# Patient Record
Sex: Female | Born: 1970 | Race: White | Hispanic: No | Marital: Married
Health system: Northeastern US, Academic
[De-identification: ages and names within clinical notes are randomized; demographics above are authoritative.]

---

## 2022-10-12 ENCOUNTER — Ambulatory Visit: Admit: 2022-10-12 | Discharge: 2022-10-12 | Payer: BLUE CROSS/BLUE SHIELD

## 2022-10-12 NOTE — Procedures (Signed)
Medication List          Accurate as of Oct 12, 2022  9:11 AM. Always use your most recent med list.            calcium polycarbophiL 625 mg tablet  Commonly known as: Fibercon     meloxicam 15 mg tablet  Commonly known as: Mobic     multivitamin with folic acid 400 mcg tablet tablet                 Additional Instructions:   Medication Instructions  1. You may have to stop taking any of the following pain or anti-inflammatory drugs one week prior to your surgery:  aspirin, ibuprofen (Advil, Motrin), and naproxen (Naprosyn).  Please ask your surgeon about these medications if haven't already received directions.  2. You may take acetaminophen (Tylenol) for pain/discomfort.  3. Only take the following medications in the morning on the day of your surgery: No medication  4. 10/19/22: Stop Meloxicam      Do's  1. DO bathe or shower the night before or the morning of surgery.    2. DO wear loose, comfortable clothing to the hospital on the day of surgery.  3. DO bring a case for your eye glasses, contact lenses or hearing aids.    Do Not's  1. DO NOT eat any solid foods after midnight the night before surgery.  You may drink CLEAR liquids (water, Gatorade, apple juice) up to 2 hours before your scheduled surgery time (NO milk, orange juice, smoothies, or anything cloudy).    2. DO NOT use breath mints or chew gum.   3. DO NOT use any deodorants, creams, colognes, powders, lotions, make-up, moisturizers, hair clips, barrettes or hair products (hair gel, hair spray or mousse).    4. DO NOT apply nail polish.  Please remove all existing nail polish.  It can affect the anesthesia monitoring equipment.   Please remove any/all piercings.   5. DO NOT shave near where you will have surgery for up to 2 days before the day of surgery.  Shaving can irritate the skin and can cause infection.  6. DO NOT bring valuables.  Leave all jewelry at home.  7. DO NOT plan to drive yourself home from the hospital.  You must arrange for  transportation home by a responsible adult.    Other Instructions  1. Please check in at the Surgical Admission Check in - [facility], 5th floor.  2. Please call [phone]) to confirm your arrival time after 2pm, the day before your surgery.

## 2022-10-12 NOTE — Anesthesia Pre-Procedure Evaluation (Signed)
Patient: Lydia Miles    Procedure Information       Date/Time: 10/26/22 0740    Procedure: Arthroplasty, Knee, Total (Left: Knee)    Location: [facility] / [facility] Operating [facility]    Surgeons: Darryl Lent, MD          Lydia Miles 52 y.o. obese (BMI 75) female with OSA using CPAP, inactive hypothyroidism (nl TSH now off replacement), and OA bilateral knees. Patient presenting for Left Total Knee Arthroplasty.      MEDICATIONS  Allergies   Allergen Reactions    Penicillins Anaphylaxis    Sulfa (Sulfonamide Antibiotics) Anaphylaxis    Levothyroxine        Current Medications:     Current Outpatient Medications:     calcium polycarbophiL (Fibercon) 625 mg tablet, Take 1,250 mg by mouth once daily., Disp: , Rfl:     meloxicam (Mobic) 15 mg tablet, Take 15 mg by mouth once daily., Disp: , Rfl:     multivitamin with folic acid 400 mcg tablet tablet, Take 1 tablet by mouth once daily., Disp: , Rfl:     SURGICAL HISTORY  Past Surgical History:   Procedure Laterality Date    APPENDECTOMY      CESAREAN SECTION, LOW TRANSVERSE      DILATION AND CURETTAGE OF UTERUS          MEDICAL HISTORY AND REVIEW OF SYSTEMS  Relevant Problems   Anesthesia (within normal limits)   (-) History of anesthesia complications      Cardio (within normal limits)      Pulmonary   (+) OSA on CPAP      GI (within normal limits)      GU/Renal (within normal limits)      Endo   (+) Obesity (BMI 38.25)   (+) Subclinical hypothyroidism (euthyroid off medication)      Hematology (within normal limits)      Neuro/Psych (within normal limits)      Musculoskeletal   (+) Osteoarthritis of both knees       Functional Status  > 4 mets    COVID Status  Partially Vaccinated    Social History  Social History     Tobacco Use    Smoking status: Former     Types: Cigarettes     Quit date: 2000     Years since quitting: 24.3    Smokeless tobacco: Never   Substance Use Topics    Alcohol use: Yes     Comment: 1-2 drinks/months    Drug use: Never        PHYSICAL EXAMINATION  Ht 1.676 m   Wt 107.5 kg   BMI 38.25 kg/m   Physical Exam    Airway  Neck ROM: full     Cardiovascular   Functional capacity: greater than or equal to 4 METS without symptoms   Dental - normal exam     Pulmonary    Abdominal    General            LABS  OSH 10/04/22: WBC 7.5, RBC 4.64, Hgb 13.0, Hct 38.9, Plt 361, Na 138, K 4.4, Cl 102, CO2 28, Gluc 80, BUN 19, Cr 0.54, GFR 112, TSH 4.0      TESTS AND IMAGING  OSH ECG 10/04/22: SR 69bpm      ASSESSMENT AND PLAN  Lydia Miles 52 y.o. female with the above listed PMH presenting for Arthroplasty, Knee, Total. The listed Medical history, Medications, Lab  Values, and Testing have been reviewed and addressed and we will proceed with the following plan:     Anesthesia Plan    ASA 3     spinal         Preprocedure Evaluation: No items outstanding.    Complex patient: No      Based on available information the patient has been optimized for surgery and does not require further testing    Risk Assessment Scores  Sleep Apnea Risk (MD Calc)  5 (High Risk of OSA)    30 day perioperative cardiac risk   cardiac event risk by 2011 Gupta (MD Calc)   cardiac event risk by 2019 RCRI (MD Calc)

## 2022-10-26 ENCOUNTER — Non-Acute Institutional Stay: Admit: 2022-10-26 | Payer: BLUE CROSS/BLUE SHIELD

## 2022-10-26 MED ORDER — cetirizine (ZyrTEC) tablet 10 mg
10 | Freq: Every day | ORAL | Status: DC
Start: 2022-10-26 — End: 2022-10-27

## 2022-10-26 MED ORDER — ceFAZolin (Ancef) injection
1 | INTRAMUSCULAR | Status: DC | PRN
Start: 2022-10-26 — End: 2022-10-26
  Administered 2022-10-26: 12:00:00 2 via INTRAVENOUS

## 2022-10-26 MED ORDER — ondansetron ODT (Zofran-ODT) disintegrating tablet 4 mg
4 | Freq: Three times a day (TID) | ORAL | Status: DC | PRN
Start: 2022-10-26 — End: 2022-10-27

## 2022-10-26 MED ORDER — ondansetron (Zofran) injection 4 mg
4 | Freq: Three times a day (TID) | INTRAMUSCULAR | Status: DC | PRN
Start: 2022-10-26 — End: 2022-10-27

## 2022-10-26 MED ORDER — acetaminophen (Tylenol) suppository 650 mg
650 | Freq: Four times a day (QID) | RECTAL | Status: DC
Start: 2022-10-26 — End: 2022-10-27

## 2022-10-26 MED ORDER — acetaminophen (Tylenol) tablet 975 mg
325 | Freq: Once | ORAL | Status: AC
Start: 2022-10-26 — End: 2022-10-26
  Administered 2022-10-26: 11:00:00 975 mg via ORAL

## 2022-10-26 MED ORDER — ropivacaine (Naropin) 0.2 % infusion
0.2 | INTRAMUSCULAR | Status: DC | PRN
Start: 2022-10-26 — End: 2022-10-26
  Administered 2022-10-26: 14:00:00 100

## 2022-10-26 MED ORDER — vancomycin in 0.9 % sodium chl 1.5 gram/300 mL solution 1,500 mg
1.5 | Freq: Once | INTRAVENOUS | Status: DC
Start: 2022-10-26 — End: 2022-10-26

## 2022-10-26 MED ORDER — docusate sodium (Colace) capsule 100 mg
100 | Freq: Two times a day (BID) | ORAL | Status: DC
Start: 2022-10-26 — End: 2022-10-27
  Administered 2022-10-27 (×2): 100 mg via ORAL

## 2022-10-26 MED ORDER — lactated Ringer's infusion
INTRAVENOUS | Status: DC | PRN
Start: 2022-10-26 — End: 2022-10-26
  Administered 2022-10-26: 12:00:00 via INTRAVENOUS

## 2022-10-26 MED ORDER — diphenhydrAMINE (BENADryl) injection 25 mg
50 | Freq: Four times a day (QID) | INTRAMUSCULAR | Status: DC | PRN
Start: 2022-10-26 — End: 2022-10-27

## 2022-10-26 MED ORDER — aspirin EC tablet 81 mg
81 | Freq: Two times a day (BID) | ORAL | Status: DC
Start: 2022-10-26 — End: 2022-10-27
  Administered 2022-10-27: 14:00:00 81 mg via ORAL

## 2022-10-26 MED ORDER — HYDROmorphone (Dilaudid) injection 0.2 mg
2 | INTRAMUSCULAR | Status: DC | PRN
Start: 2022-10-26 — End: 2022-10-26
  Administered 2022-10-26: 16:00:00 0.2 mg via INTRAVENOUS

## 2022-10-26 MED ORDER — tranexamic acid (Cyklokapron) injection
1000 | INTRAVENOUS | Status: DC | PRN
Start: 2022-10-26 — End: 2022-10-26
  Administered 2022-10-26 (×2): 1000 via INTRAVENOUS

## 2022-10-26 MED ORDER — ketorolac (Toradol) injection 30 mg
30 | Freq: Four times a day (QID) | INTRAMUSCULAR | Status: DC
Start: 2022-10-26 — End: 2022-10-27
  Administered 2022-10-26 – 2022-10-27 (×4): 30 mg via INTRAVENOUS

## 2022-10-26 MED ORDER — propofol (Diprivan) injection
10 | INTRAVENOUS | Status: DC | PRN
Start: 2022-10-26 — End: 2022-10-26
  Administered 2022-10-26: 12:00:00 125 via INTRAVENOUS

## 2022-10-26 MED ORDER — acetaminophen (Tylenol) tablet 650 mg
325 | Freq: Four times a day (QID) | ORAL | Status: DC
Start: 2022-10-26 — End: 2022-10-27
  Administered 2022-10-26 – 2022-10-27 (×5): 650 mg via ORAL

## 2022-10-26 MED ORDER — oxyCODONE ER (OxyCONTIN) 12 hr tablet 10 mg
10 | Freq: Once | ORAL | Status: AC
Start: 2022-10-26 — End: 2022-10-26
  Administered 2022-10-26: 11:00:00 10 mg via ORAL

## 2022-10-26 MED ORDER — lactated Ringer's infusion
INTRAVENOUS | Status: DC
Start: 2022-10-26 — End: 2022-10-26
  Administered 2022-10-26: 16:00:00 100 mL/h via INTRAVENOUS

## 2022-10-26 MED ORDER — oxyCODONE (Roxicodone) immediate release tablet 5 mg
5 | ORAL | Status: DC | PRN
Start: 2022-10-26 — End: 2022-10-27
  Administered 2022-10-26 – 2022-10-27 (×3): 5 mg via ORAL

## 2022-10-26 MED ORDER — oxyCODONE (Roxicodone) immediate release tablet 10 mg
5 | ORAL | Status: DC | PRN
Start: 2022-10-26 — End: 2022-10-27

## 2022-10-26 MED ORDER — oxyCODONE (Roxicodone) solution 5 mg
5 | ORAL | Status: DC | PRN
Start: 2022-10-26 — End: 2022-10-27

## 2022-10-26 MED ORDER — fentaNYL (Sublimaze) injection 25 mcg
50 | INTRAMUSCULAR | Status: DC | PRN
Start: 2022-10-26 — End: 2022-10-26
  Administered 2022-10-26 (×2): 25 ug via INTRAVENOUS

## 2022-10-26 MED ORDER — docusate sodium (Colace) oral liquid 100 mg
50 | Freq: Two times a day (BID) | ORAL | Status: DC
Start: 2022-10-26 — End: 2022-10-27

## 2022-10-26 MED ORDER — HYDROmorphone (Dilaudid) injection 0.4 mg
2 | INTRAMUSCULAR | Status: DC | PRN
Start: 2022-10-26 — End: 2022-10-27

## 2022-10-26 MED ORDER — midazolam (Versed) injection
1 | INTRAMUSCULAR | Status: DC | PRN
Start: 2022-10-26 — End: 2022-10-26
  Administered 2022-10-26: 12:00:00 2 via INTRAVENOUS

## 2022-10-26 MED ORDER — multivitamin with folic acid 1 tablet
400 | Freq: Every day | ORAL | Status: DC
Start: 2022-10-26 — End: 2022-10-27
  Administered 2022-10-27: 14:00:00 1 via ORAL

## 2022-10-26 MED ORDER — psyllium (Metamucil) 3.4 gram packet 1 packet
3.4 | Freq: Every day | ORAL | Status: DC
Start: 2022-10-26 — End: 2022-10-27
  Administered 2022-10-27: 14:00:00 1 via ORAL

## 2022-10-26 MED ORDER — ondansetron (Zofran) injection
4 | INTRAMUSCULAR | Status: DC | PRN
Start: 2022-10-26 — End: 2022-10-26
  Administered 2022-10-26: 13:00:00 4 via INTRAVENOUS

## 2022-10-26 MED ORDER — acetaminophen (Tylenol) solution 650 mg
325 | Freq: Four times a day (QID) | ORAL | Status: DC
Start: 2022-10-26 — End: 2022-10-27

## 2022-10-26 MED ORDER — ketorolac (Toradol) injection
30 | INTRAMUSCULAR | Status: DC | PRN
Start: 2022-10-26 — End: 2022-10-26
  Administered 2022-10-26: 14:00:00 15 via INTRA_ARTICULAR

## 2022-10-26 MED ORDER — dexAMETHasone (Decadron) injection
4 | INTRAMUSCULAR | Status: DC | PRN
Start: 2022-10-26 — End: 2022-10-26
  Administered 2022-10-26: 14:00:00 1 via INTRAMUSCULAR

## 2022-10-26 MED ORDER — celecoxib (CeleBREX) capsule 200 mg
200 | Freq: Once | ORAL | Status: AC
Start: 2022-10-26 — End: 2022-10-26
  Administered 2022-10-26: 11:00:00 200 mg via ORAL

## 2022-10-26 MED ORDER — oxyCODONE (Roxicodone) solution 10 mg
5 | ORAL | Status: DC | PRN
Start: 2022-10-26 — End: 2022-10-27

## 2022-10-26 MED ORDER — ceFAZolin (Ancef) 2 g in sodium chloride 0.9 % 100 mL IVPB-MBP
2 | Freq: Three times a day (TID) | INTRAMUSCULAR | Status: AC
Start: 2022-10-26 — End: 2022-10-27
  Administered 2022-10-26 – 2022-10-27 (×2): 2 g via INTRAVENOUS

## 2022-10-26 MED ORDER — naloxone (Narcan) injection 0.1 mg
0.4 | INTRAMUSCULAR | Status: DC | PRN
Start: 2022-10-26 — End: 2022-10-27

## 2022-10-26 MED ORDER — BUPivacaine-EPINEPHrine (PF) (Marcaine w/EPI) 0.25 %-1:200,000 injection
0.25 | INTRAMUSCULAR | Status: DC | PRN
Start: 2022-10-26 — End: 2022-10-26
  Administered 2022-10-26: 14:00:00 30

## 2022-10-26 MED ORDER — ondansetron (Zofran) injection 4 mg
4 | INTRAMUSCULAR | Status: DC | PRN
Start: 2022-10-26 — End: 2022-10-26

## 2022-10-26 MED ORDER — fentaNYL (Sublimaze) injection
50 | INTRAMUSCULAR | Status: DC | PRN
Start: 2022-10-26 — End: 2022-10-26
  Administered 2022-10-26 (×2): 50 via INTRAVENOUS

## 2022-10-26 MED ORDER — phenylephrine 20 mg/250 mL (80 mcg/mL) in NS infusion
80 | INTRAVENOUS | Status: DC | PRN
Start: 2022-10-26 — End: 2022-10-26
  Administered 2022-10-26: 12:00:00 .5 via INTRAVENOUS

## 2022-10-26 MED FILL — CETIRIZINE 10 MG TABLET: 10 10 mg | ORAL | Qty: 1

## 2022-10-26 MED FILL — ACETAMINOPHEN 325 MG TABLET: 325 325 mg | ORAL | Qty: 2

## 2022-10-26 MED FILL — CEFAZOLIN IVPB 2 G IN 100 ML NS (MINI-BAG PLUS): 2.0000 2.0000 g | Qty: 2000

## 2022-10-26 MED FILL — FENTANYL (PF) 50 MCG/ML INJECTION SOLUTION: 50 50 mcg/mL | INTRAMUSCULAR | Qty: 2

## 2022-10-26 MED FILL — HYDROMORPHONE 2 MG/ML INJECTION WRAPPER: 2 2 mg/mL | INTRAMUSCULAR | Qty: 1

## 2022-10-26 MED FILL — OXYCODONE 5 MG TABLET: 5 5 mg | ORAL | Qty: 1

## 2022-10-26 MED FILL — KETOROLAC 30 MG/ML (1 ML) INJECTION SOLUTION: 30 30 mg/mL (1 mL) | INTRAMUSCULAR | Qty: 1

## 2022-10-26 MED FILL — OXYCODONE ER 10 MG TABLET,CRUSH RESISTANT,EXTENDED RELEASE 12 HR: 10 10 mg | ORAL | Qty: 1

## 2022-10-26 NOTE — Interval H&P Note (Deleted)
H&P reviewed. The patient was examined and there are no changes to the H&P.

## 2022-10-26 NOTE — Progress Notes (Signed)
SUBJECTIVE  Patient feeling well.  She denies fevers, lightheadedness, dizziness, chest pain, shortness of breath, nausea or vomiting.  She has been able to eat.  She has voided without difficulty.  She has been up and working with physical therapy.    OBJECTIVE  Well-appearing pleasant female no acute distress.  Alert and oriented x 3.  LLE: Bulky dressing is clean dry and intact.  The calf is soft and nontender.  She is neurovascular intact distally.  DP pulses palpable.  Sensation intact to light touch over DP/SP/tibial nerves.  Positive EHL/FHL/ant tib.      ASSESSMENT AND PLAN   52 year old female status post left total knee arthroplasty postop day 0  -Weightbearing as tolerated  -Pain control per orders: Oxycodone as needed, Tylenol and Toradol  -DVT prophylaxis: Aspirin 81 mg twice daily to start postop day 1  -Continue with PT and OT  -Encouraged p.o. intake.  Patient voiding without difficulty.  She reports she is drinking water frequently.  We will stop IV fluids at this point.  -Disposition: Goal is home with VNA services likely tomorrow.

## 2022-10-26 NOTE — Consults (Addendum)
Physical Therapy Evaluation    Patient Name: Lydia Miles  MRN: [id number]  DOB: 08/15/1970  Evaluation Date: 10/26/2022    Subjective   HPI/Hospital Course:   Patient is a 52 year old female. POD0 s/p L TKA. Orders for WBAT. PT consulted for discharge planning and intervention as indicated.    Patient Active Problem List   Diagnosis   . Subclinical hypothyroidism   . Osteoarthritis of both knees   . Obesity   . OSA on CPAP   . Status post total knee replacement, left   . Impaired gait       History reviewed. No pertinent past medical history.    Past Surgical History:   Procedure Laterality Date   . APPENDECTOMY     . CESAREAN SECTION, LOW TRANSVERSE     . DILATION AND CURETTAGE OF UTERUS         Objective     General Information  General Info  PT Received On: 10/26/22  Following Therapy Session:: Call bell within reach, Patient in Bed, Nursing Staff Aware of Patient Location  Plan of Care Reviewed With:: Patient, RN/Charge Nurse, PT  Recommended mobility with nursing staff: assist x1 with RW to bathroom/hall, please encourage pt to walk 3-5x/day  Eval Information  Type of Evaluation: Initial Evaluation    Precautions  Weight Bearing Status-1: LLE, Weight Bearing as Tolerated  Other Precautions: Fall Risk    Home Living  Was Patient Admitted from STR?: No  Lives With: Family (husband and kids (108 year old twins and 29 year old))  Type of Home: House  Number of Stairs to Enter Home: 2 then a landing (no railing?) or 1 through the back  Number of Stairs Within Home: 0  Home Equipment: Single [employer]    Prior Level of Function  Information Provided By: Patient  Independent at Baseline: All ADL's/IADL's, All Community Mobility  History of Recent Falls: No  Occupational Profile/Social History: 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, intermittently uses the cane, has some R LE joint pain as well    Pain  Pain Assessment: 0-10  Pain Score: 1 ("0.5" out of 10)       Vital Signs  Vital Signs  Pulse:  95  Oxygen Therapy  SpO2: 96 %  Oxygen Therapy: None (Room air)    Cognition  Overall Cognitive Status: Within Functional Limits    Integumentary  Integumentary: PIV, ACE wrap to L LE c/d/i  LDA Integrity: Intact Pre and Post Therapy    ROM/Strength  PROM Right Upper Extremity  Overall RUE PROM: WFL  PROM Left Upper Extremity  Overall LUE PROM: WFL  PROM Right Lower Extremity  Overall RLE PROM: WFL  PROM Left Lower Extremity  Overall LLE PROM: Impaired (lacks terminal knee extension and knee flexion past about 90 deg)  Strength Right Upper Extremity  RUE Overall Strength: WFL  Strength Left Upper Extremity  LUE Overall Strength: WFL  Strength Right Lower Extremity  RLE Overall Strength: WFL  Strength Left Lower Extremity  LLE Overall Strength: Impaired (grossly 4/5 about the knee)    Neuromuscular Assessment  Coordination: WFL  Sensation: WFL         Mobility Assessment  Bed Position: HOB elevated  Supine to Sit: Independent  Scooting: Independent  Bed Mobility Comments: manage L LE well to clear over EOB    Sit to Stand: Modified Independent, Supervision, Cues for Sequencing  Stand to Sit: Modified Independent  Assistive Device: [employer]  Transfers Comments: progressed from supervision to mod I within session; rises from EOB and toilet    Distance (Feet): 120'  Assistance Level: Modified Independent  Assistive Device: Rolling Walker  Gait Characteristics: slow step to pattern with very short step length, heavy UE use to offload L LE during stance  Ambulation Comments: some increase in step length and progress toward reciprocal gait pattern during session              Sitting Balance - Static: indep  Sitting Balance - Dynamic: indep  Standing Balance - Static: indep  Standing Balance - Dynamic: mod I with RW  Descriptive Comments: steady to stand and perform ADLs without UE support    AMPAC - (Basic Mobility Inpatient Short Form) How much help from another person do you currently need?  Turning from your back  to your side while in a flat bed without using bedrails?: 4 - None  Moving from lying on your back to sitting on the side of a flat bed without using bedrails?: 4 - None  Moving to and from a bed to a chair (including a wheelchair)?: 4 - None  Standing up from a chair using your arms (e.g., wheelchair, or bedside chair)?: 4 - None  To walk in hospital room?: 4 - None  Climbing 3-5 steps with a railing?: 3 - A little  Raw Score: 23         Education  Education Provided: Role of PT/Plan of Care, Discharge Planning, Mobility Training, Safety Awareness/Fall Risk, Activities Guidelines, Pacing  Education Provided to: Patient  Teaching Method: Discussion  Learning Evaluation: Verbalizes understanding, Demonstrates/applies knowledge, Needs practice  Comments: discussed post op expectations re: pain, discussing activity pacing and importance of OOB activity or at least ranging L LE throughout day to prevent stiffness and optimize range, discussed d/c planning for likely home tomorrow    Assessment   Patient is a 52 year old female. POD0 s/p L TKA. Patient presents with anticipate post-op mobility impairments, with mobility primarily impaired d/t L knee pain, weakness, and impaired ROM. Pt with fair strength to compensate with L LE impairments and tolerates hallway distance gait well with use of RW. Anticipate pt will achieve STGs for safe d/c home in 1-2 additional PT sessions.     Patient presents with Impaired gait, Functional Mobility deficit, Impaired activity tolerance, Pain, Balance deficit, Strength deficit, Range of motion deficit       Prognosis: Excellent       Goals   Short Term Goals  Date Established/Amended: 10/26/22  Goal timeframe: 1-2 days  Ambulation goal: pt will be able to ambulate >300' mod I with RW  Stairs goal: pt will be able to negotiate 2 stairs with CTG    Long Term Goals  Goal established/Amended: 10/26/22  Goal timeframe: 8 weeks  Ambulation goal: pt will be able to ambulate >1000'  independently    Plan   Plan: Continued Skilled Inpatient PT Services   Treatment Interventions: Therapeutic exercise, Assistive device training, Functional mobility training, Gait training, Stair training, Patient/family education, Neuromuscular Re-education/balance  PT Frequency and Duration: Twice Daily  Treatment: Comment: 1 unit self care home mgmt for pt education as detailed above    Recommendations  Anticipate Discharge to: Home with support/services  Discharge Assist/Support Needed: Intermittent Supervision, Physical assistance from family/caregiver  Home services recommended: Home PT, Home OT  Discharge Equipment Anticipated: Rolling Walker  Recommended mobility with nursing staff: assist x1 with RW to bathroom/hall, please encourage  pt to walk 3-5x/day  Homero Fellers, PT  [place] lic # [id number]

## 2022-10-26 NOTE — Op Note (Signed)
Pt A&OX4 very anxious and nervous, very pleasant. Denies open wounds, rashes, pain.

## 2022-10-26 NOTE — Anesthesia Procedure Notes (Signed)
Spinal Block    Date/Time: 10/26/2022 8:05 AM    Patient Location:  OR  Reason for Block: primary anesthetic    Performed by:  CRNA  Anesthesiologist:  Loleta Chance, MD  Resident/CRNA:  Ivy Lynn Sherica Paternostro, CRNA  patient identified, anesthesia consent, monitors and equipment checked, pre-op evaluation and timeout performed    Patient Position:  Sitting  Prep: ChloraPrep    Sterility Prep:  Cap, sterile gloves, mask and drape  Sedation Level: light sedation    Monitoring:  Cardiac monitor and continuous pulse oximetry  Approach:  Midline  Location:  L3-4  Guidance: landmarks    Needle Type:  Pencan  Needle Gauge:  25 G  Needle Length:  8.9 cm  Injection technique: single-shot  1% lidocaine used to infiltrate the skin and subcutaneous tissue

## 2022-10-26 NOTE — Anesthesia Procedure Notes (Signed)
Peripheral IV  Date/Time: 10/26/2022 7:20 AM  Inserted by: Lynnell Chad, CRNA    Placement  Needle size: 20 G  Laterality: right  Location: wrist  Site prep: alcohol  Technique: anatomical landmarks  Attempts: 1

## 2022-10-26 NOTE — Nursing Note (Signed)
Pt is A/Ox4.  Pt crying this evening due to bleeding at incision site. Blood noted on ace wrap. Dr. Lelon Huh informed.  Staining outlined and per Dr. Lelon Huh dressing was reinforced with abd pads and another ace wrap.  Bleeding did not get any worse.  Called Dr. Lelon Huh to ask if I could give the toradol 30mg  and he stated it was ok to give the toradol.  Pt to start aspirin in the morning. Pt on scheduled tylenol and prn oxycodone.  Pt with minimal c/o pain.  Per Dr. Lelon Huh keep pt in bed this eve and overnight.  Pt using the bedpan to urinate. Pt was up and walking and sitting in chair prior to the bleeding.  Pt was ambulating with a walker. Service will reassess the incision in the morning.

## 2022-10-26 NOTE — Nursing Note (Signed)
A&Ox4. VSS. Met DTV. Ambulating well with walker. Surgical dressing intact. D/c tomorrow.

## 2022-10-26 NOTE — Anesthesia Post-Procedure Evaluation (Addendum)
Patient: Lydia Miles    Procedure Summary     Date: 10/26/22 Room / Location: [facility] 12 / [facility] Operating [facility]    Anesthesia Start: 0801 Anesthesia Stop: 1028    Procedure: Arthroplasty, Knee, Total (Left: Knee) Diagnosis: (PRIMARY OSTEOARTHRITIS OF KNEES BILATERAL)    Surgeons: Darryl Lent, MD Responsible Provider: Loleta Chance, MD    Anesthesia Type: spinal ASA Status: 3          Anesthesia Type: spinal    Vitals Value Taken Time   BP 116/75 10/26/22 1021   Temp 36.2 C (97.2 F) 10/26/22 1010   Pulse 72 10/26/22 1027   Resp 8 10/26/22 1027   SpO2 100 % 10/26/22 1027   Vitals shown include unfiled device data.    Anesthesia Post Evaluation Note:    Patient location during evaluation: PACU  Patient participation: able to participate  Level of consciousness: awake  Cardiovascular and Hydration status: vital signs within acceptable range  Respiratory Status Stable and Airway Patent: yes  Nausea and Vomiting Control Satisfactory: yes  Pain management: adequate     Aldrete score reviewed: yes  Vitals reviewed: yes  Unplanned ICU Admission: noPatient has recovered from anesthesia and has returned to baseline mental status, cardiovascular and respiratory function. Pain, nausea, and vomiting are adequately controlled and the patient is adequately hydrated and appropriate for discharge from PACU?: yes      No notable events documented.

## 2022-10-26 NOTE — Addendum Note (Signed)
Addendum  created 10/26/22 1157 by Clydia Llano, MD    Clinical Note Signed

## 2022-10-26 NOTE — Op Note (Signed)
Arthroplasty, Knee, Total (L) Operative Note     Date: 10/26/2022  Location: [facility] OR    Name: Lydia Miles, DOB: 04/07/1971, MRN: [id number]    Diagnosis  * No Diagnosis Codes entered * * No Diagnosis Codes entered *     Procedures  Arthroplasty, Knee, Total  54098 - PR TOTAL KNEE ARTHROPLASTY      Surgeons      * Darryl Lent - Primary    Procedure Summary  Anesthesia: Spinal  ASA: III  Estimated Blood Loss: 10  Total IV Fluids:   Drains: * None in log *    Implants         Staff:   Circulator: Cherre Blanc, RN    Date of procedure 10/26/2022  Preoperative diagnosis osteoarthritis left knee with loose bodies  Postoperative diagnosis osteoarthritis left knee with loose bodies  Procedure left total knee replacement with removal multiple loose bodies  Surgeon Dr. Levin Bacon.  First assist Rondel Oh PA  Anesthesia spinal  Indications this patient has failed maximized nonoperative care now presents for elective left total knee arthroplasty  Procedure patient was brought in the operating room.  The patient received a spinal anesthetic as per anesthesia.  Excellent block was obtained.  Patient received 2 g of Kefzol IV without adverse reaction.  Vital signs stable.  Patient placed supine on operating room table.  Patient had a tourniquet placed high in the left thigh.  Leg was prepped and draped in usual manner.  Esmarch bandage was used to exsanguinate the leg.  Tourniquet was inflated to 250 mmHg.  Surgical timeout was done with nursing anesthesia and surgery all agreeing with the left knee as site of surgery.  The knee was flexed and anterior midline incision was made.  The medial and lateral flaps subcu were infiltrated with 30 cc of Marcaine with epinephrine and quarter percent.  A median parapatellar capsulotomy was done.  Fluid was suctioned.  Patella was everted.  The fat pad was excised. There were multiple osseos loose bodies removed and bare bone in all 3 compartments A drill hole was placed in  the distal femur.  Alignment guide was placed and cut was made in 6 degrees of valgus 12 mm.  Once this was accomplished the femur was sized.  The anterior posterior chamfer cuts were made.  The finishing block cut was made.  This was made for a size 4left femur.  Attention was turned to the tibia where an osteotomy was done taking normal bone laterally then medially to collect correct and varus deformity.  This wedge of bone was removed.  The knee balanced with a block in extension and flexion.  A size 10 poly insert was used.The tibia was prepared for a central drill hole and broached for a size 3 tibial tray  Patella osteotomy was done 3 drill holes were placed for a size 35 patella button.  Trial reduction was done patient had full extension flexion 130 degrees stable extension flexion drawer was negative.  Trial components removed.  A periarticular block was then placed using 100 cc of ropivacaine 15 mg of Toradol 4 mg of steroid and epinephrine.  This was injected throughout the periarticular tissues with dry before injecting.  Once this was accomplished the knee was cleansed with pulsatile lavage.  2 bags Simplex P mixed in a vacuum.  Does state cement was injected in the tibia femur and patella.  Implants were impacted with a mallet.  Excess cement removed.  Knee placed in extension patella clamp used.  Once cement had cured any excess cement was removed.  Previous range of motion and stability were obtained.  Tourniquet released.  Hemostasis achieved Bovie cautery.  Wound was irrigated with sterile Betadine solution and then power washed.  Hemostasis excellent.  Sponge needle count x2.  Capsule 0 Vicryl.  Subcu 2-0 Vicryl skin closed with staples soft tissue dressing applied Ace wrap from toes to groin.Patient is alert vital signs stable and transferred recovery room in stable condition.        Darryl Lent  Phone Number: [phone]

## 2022-10-27 LAB — CBC WITH DIFFERENTIAL
Basophils %: 0.3 %
Basophils Absolute: 0.03 10*3/uL (ref 0.00–0.22)
Eosinophils %: 0.4 %
Eosinophils Absolute: 0.05 10*3/uL (ref 0.00–0.50)
Hematocrit: 34.1 % (ref 32.0–47.0)
Hemoglobin: 11 g/dL (ref 11.0–16.0)
Immature Granulocytes %: 0.3 %
Immature Granulocytes Absolute: 0.04 10*3/uL (ref 0.00–0.10)
Lymphocyte %: 19.2 %
Lymphocytes Absolute: 2.2 10*3/uL (ref 0.70–4.00)
MCH: 27.6 pg (ref 26.0–34.0)
MCHC: 32.3 g/dL (ref 31.0–37.0)
MCV: 85.5 fL (ref 80.0–100.0)
MPV: 9.1 fL (ref 9.1–12.4)
Monocytes %: 10.9 %
Monocytes Absolute: 1.25 10*3/uL — ABNORMAL HIGH (ref 0.36–0.77)
NRBC %: 0 % (ref 0.0–0.0)
NRBC Absolute: 0 10*3/uL (ref 0.00–2.00)
Neutrophil %: 68.9 %
Neutrophils Absolute: 7.88 10*3/uL (ref 1.50–7.95)
Platelets: 308 10*3/uL (ref 150–400)
RBC: 3.99 M/uL (ref 3.70–5.20)
RDW-CV: 14.4 % (ref 11.5–14.5)
RDW-SD: 44.7 fL (ref 35.0–51.0)
WBC: 11.5 10*3/uL — ABNORMAL HIGH (ref 4.0–11.0)

## 2022-10-27 MED ORDER — oxyCODONE (Roxicodone) 5 mg immediate release tablet
5 | ORAL_TABLET | ORAL | 0 refills | 8.00000 days | Status: AC | PRN
Start: 2022-10-27 — End: 2022-11-03
  Filled 2022-10-27: qty 30, 5d supply, fill #0

## 2022-10-27 MED ORDER — docusate sodium (Colace) 100 mg capsule
100 | ORAL_CAPSULE | Freq: Two times a day (BID) | ORAL | 0 refills | Status: AC | PRN
Start: 2022-10-27 — End: ?
  Filled 2022-10-27: qty 60, 30d supply, fill #0

## 2022-10-27 MED ORDER — aspirin 81 mg EC tablet
81 | ORAL_TABLET | Freq: Two times a day (BID) | ORAL | 0 refills | Status: AC
Start: 2022-10-27 — End: ?
  Filled 2022-10-27: qty 135, 84d supply, fill #0

## 2022-10-27 MED FILL — CETIRIZINE 10 MG TABLET: 10 10 mg | ORAL | Qty: 1

## 2022-10-27 MED FILL — ACETAMINOPHEN 325 MG TABLET: 325 325 mg | ORAL | Qty: 2

## 2022-10-27 MED FILL — MULTIVITAMIN WITH FOLIC ACID 400 MCG TABLET: 400 400 mcg | ORAL | Qty: 1

## 2022-10-27 MED FILL — KETOROLAC 30 MG/ML (1 ML) INJECTION SOLUTION: 30 30 mg/mL (1 mL) | INTRAMUSCULAR | Qty: 1

## 2022-10-27 MED FILL — ASPIRIN 81 MG TABLET,DELAYED RELEASE: 81 81 mg | ORAL | Qty: 1

## 2022-10-27 MED FILL — PSYLLIUM HUSK 3.4 GRAM ORAL POWDER PACKET WRAPPER: 3.4 3.4 gram | ORAL | Qty: 1

## 2022-10-27 MED FILL — DOCUSATE SODIUM 100 MG CAPSULE: 100 100 mg | ORAL | Qty: 1

## 2022-10-27 MED FILL — CEFAZOLIN IVPB 2 G IN 100 ML NS (MINI-BAG PLUS): 2.0000 2.0000 g | Qty: 2000

## 2022-10-27 MED FILL — OXYCODONE 5 MG TABLET: 5 5 mg | ORAL | Qty: 1

## 2022-10-27 NOTE — Care Plan (Signed)
Occupational Therapy Deferral Note      Patient Name: Lydia Miles  MRN: [id number]  DOB: [birth year]    Deferral Reason: No skilled OT needs. OT consult received and appreciated. Per discussion with RN and PT pt ambulating steadily and there are no acute care OT needs identified at this time. OT will sign off. Please re-consult if functional impairments arise.     Sabino Gasser, OT  [place] lic # [id number]

## 2022-10-27 NOTE — Nursing Note (Signed)
Left knee incision well approximated w/ staples.  Site observed when PA Haas om tp change dressing.  + PP BLEs,  + CSM.  Pain well controlled w/ Toradol IV % PO Oxycodone.  Plan for d/c home today.

## 2022-10-27 NOTE — Other (Signed)
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     Preferred Language     Preferred Language  English      Advanced Directive Assessment    Flowsheet Row Most Recent Value   Advance Directive Patient does not have advance directive Filed at 10/26/2022 1619         Medication List      START taking these medications    aspirin 81 mg EC tablet  Take 1 tablet (81 mg) by mouth twice daily for 6 weeks, then once daily for 6 more weeks as directed  Last time this was given: Oct 27, 2022  9:56 AM     docusate sodium 100 mg capsule  Commonly known as: Colace  Take 1 capsule (100 mg) by mouth if needed in the morning and at bedtime for constipation.  Last time this was given: Oct 27, 2022  9:55 AM     oxyCODONE 5 mg immediate release tablet  Commonly known as: Roxicodone  Take 1 tablet (5 mg) by mouth every 4 (four) hours as needed for moderate pain  Last time this was given: Oct 27, 2022  1:48 AM        CONTINUE taking these medications    acetaminophen 325 mg tablet  Commonly known as: Tylenol  Take 650 mg by mouth every 6 (six) hours if needed.  Last time this was given: Oct 27, 2022  6:24 AM     calcium polycarbophiL 625 mg tablet  Commonly known as: Fibercon  Take 1,250 mg by mouth once daily.     fexofenadine 180 mg tablet  Commonly known as: Allegra  Take 180 mg by mouth once daily.     multivitamin with folic acid 400 mcg tablet tablet  Take 1 tablet by mouth once daily.  Last time this was given: Oct 27, 2022  9:56 AM        STOP taking these medications    meloxicam 15 mg  tablet  Commonly known as: Mobic              Discharge Referral Orders (From admission, onward)     Start     Ordered    10/27/22 0000  Ambulatory referral to Home Health/VNA        Comments: I attest that I or another qualified licensed provider saw Naveya Peshek 90 days prior to or 30 days post admission and this face to face encounter meets the necessary Home Health requirements. The face to face encounter occurred on (date) 10/27/22.    The encounter with the patient was in whole, or in part, for the following medical condition, which is the primary reason for home health care. (List medical condition) sp L TOTAL KNEE ARTHROPLASTY    I certify that, based on my findings, the following services are medically necessary  skilled home health services: Evaluate and Treat.    Further, I certify that my clinical findings support this patient's homebound status (i.e. absences from home require considerable and taxing effort, are for health treatment, or for attendance at religious events; absences from home for nonmedical reasons are infrequent or are of relatively short duration).    The clinical findings that support the need for home care and homebound status are due to illness and the patient has a condition such that leaving his/her home is medically contraindicated.  There exists a normal inability to leave home and leaving home requires a considerable and taxing effort including worsening clinical course   Question Answer Comment   On what date was the Face to Face Encounter? 10/27/2022    Disciplines Requested Skilled Nursing    Disciplines Requested Physical Therapy    Disciplines Requested Occupational Therapy    Services Requested Skilled Assessment    Is patient a part of Maternal Child Health program? No    Physician to follow patient's care PCP        10/27/22 1124            Nursing Post-Acute/Referral Information (Page 2)    Flowsheet Row Most Recent Value   Self Care Activities Functional Level     Dressing Independent   Grooming Independent   Feeding Independent   Bathing Independent   Toileting Independent   Height and Weight    Height 1.676 m   Height Method Stated   Weight 108 kg   Weight Method Stated      Patient Lines/Drains/Airways Status     Active Lines, Drains, Airways        Peripheral IV 10/26/22 Right    Placement date 10/26/22  10/26/22 0817 Site --     Placement time 0720  created via procedure documentation  10/26/22 0817 Days 1    Size (Gauge): 20 G  10/26/22 0817 Orientation: Right  10/26/22 0817    Site Prep: Alcohol  10/26/22 0817 Placed by: Lynnell Chad, CRNA  10/26/22 0817    Insertion attempts: 1  10/26/22 0817      Assessments     Row Name 10/27/22 1100 10/27/22 0930 10/27/22 0400 10/27/22 0100    Site Assessment Clean;Dry;Intact  10/27/22 1102 --  Clean;Dry;Intact  10/27/22 0428 --     Dressing Type Transparent  10/27/22 1102 --  Transparent  10/27/22 0428 --     Lumen Status Flushed  10/27/22 1102 --  Flushed  10/27/22 0428 --     Dressing Status Clean/Dry/Intact  10/27/22 1102 --  Clean/Dry/Intact  10/27/22 0428 --              Wound 10/26/22 Surgical Knee Anterior;Left    Date First Assessed 10/26/22  10/26/22 0926 Site Knee  10/26/22 0926    Time First Assessed --  Days 1    Hand Hygiene Completed: Yes  10/26/22 0926 Primary Wound Type: Surgical  10/26/22 0926    Wound Location Orientation: Anterior;Left  10/26/22 0926      Assessments     Row Name 10/27/22 1100 10/27/22 0930 10/27/22 0400 10/27/22 0100    Site Assessment --  --  Dsg LLE changed by Evorn Gong, PA is now CDI  10/27/22 1017 --  Unable to assess  10/27/22 0244    Peri-Wound Assessment --  --  --  Unable to assess  10/27/22 0244    Drainage Amount --  --  --  Unable to  assess  10/27/22 0244    Dressing --  Compression bandage;Other (Comment)  Dsg w/ Ace wrap CDI  10/27/22 1017 --  Compression bandage;Other (Comment)  10/27/22 0244    Dressing Status --  Clean/Dry/Intact  10/27/22 1017 --  Clean/Dry/Intact   10/27/22 0244                  After Discharge Information    Flowsheet Row .   Visiting Nurse Agency: [facility]   Visiting Nurse Agency Address: [address]  [place], [place] [address]   Visiting Nurse Agency Phone #: [phone]      General Therapy Patient Information (Page 3)    Flowsheet Row Most Recent Value   Precautions    Weight Bearing Status-1 LLE, Weight Bearing as Tolerated Filed at 10/27/2022 1041   Other Precautions Fall Risk Filed at 10/27/2022 1041   Home Living    Was Patient Admitted from STR? No Filed at 10/26/2022 1348   Lives With [facility] and kids (30 year old twins and 63 year old)] Filed at 10/26/2022 1348   Type of Home House Filed at 10/26/2022 1348   Number of Stairs to Enter Home 2 then a landing (no railing?) or 1 through the back Filed at 10/26/2022 1348   Number of Stairs Within Home 0 Filed at 10/26/2022 1348   Home Equipment Single [place] Filed at 10/26/2022 1348   Prior Level of Function    Information Provided By Patient Filed at 10/26/2022 1348   Independent at Baseline All ADL's/IADL's, All Community Mobility Filed at 10/26/2022 1348   History of Recent Falls No Filed at 10/26/2022 1348   Occupational Profile/Social History 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, intermittently uses the cane, has some R LE joint pain as well Filed at 10/26/2022 1348   Cognition    Overall Cognitive Status WFL Filed at 10/27/2022 1041      Combined Therapy Assessment (Page 3)    Flowsheet Row Most Recent Value   Bed Mobility    Bed Position HOB elevated Filed at 10/27/2022 1041   Supine to Sit Independent Filed at 10/27/2022 1041   Scooting Independent Filed at 10/27/2022 1041   Bed Mobility Comments manage L LE well to clear over EOB Filed at 10/26/2022 1348   Transfers    Sit to Stand Modified Independent Filed at 10/27/2022 1041   Stand to Sit Modified Independent Filed at 10/27/2022 1041   Transfers Comments progressed from supervision to  mod I within session,  rises from EOB and toilet Filed at 10/26/2022 1348      Physical Therapy - Mobility (Page 3)    Flowsheet Row Most Recent Value   Ambulation    Distance (Feet) 240' Filed at 10/27/2022 1041   Assistance Level Modified Independent Filed at 10/27/2022 1041   Assistive Device Rolling Walker Filed at 10/26/2022 1348   Gait Characteristics slow step to pattern with very short step length, heavy UE use to offload L LE during stance Filed at 10/27/2022 1041   Ambulation Comments pt initially stiff, but with increase in step length and gait speed within session Filed at 10/27/2022 1041   Stairs    Number of Stairs 4 Filed at 10/27/2022 1041   Assistance Level Supervision Filed at 10/27/2022 1041   Assistive Device None Filed at 10/27/2022 1041   Railing Bilateral Filed at 10/27/2022 1041   Technique Step-to up and down Filed at 10/27/2022 1041  Stairs Comments pt leading up with R LE and leading with L LE to descend Filed at 10/27/2022 1041             Discharge Summary      Discharge Summary by Mariana Single, PA at 10/27/2022 11:31 AM  Version 1 of 1    Author: Mariana Single, PA Service: Orthopedics Author Type: Physician Assistant    Filed: 10/27/2022 11:38 AM Date of Service: 10/27/2022 11:31 AM Status: Cosign Needed    Editor: Mariana Single, PA (Physician Assistant) Cosign Required: Yes         Discharge Summary    Discharge Diagnosis  Status post total knee replacement, left    Hospital Course   Patient is a pleasant 52 year old female who is followed by Dr. Juanetta Gosling for primary osteoarthritis of the left knee.  She has had a several year history of left knee pain progressively worsening over time.  It interferes with activities of daily living and causes her significant pain.  Despite conservative treatment options including steroid injection, oral medication, and activity modification, she still has pain.  After discussion with Dr. Juanetta Gosling last visit, she would like to proceed with a left total knee  replacement.    After preoperative evaluation and medical clearance, she was brought to the operating room on Oct 26, 2022 where an uncomplicated left total knee arthroplasty was performed by Dr. Juanetta Gosling.  She was given periarticular injections at the time of surgery which minimized her pain for the first 24 hours.  Her pain was then managed with oral oxycodone.  She was put on aspirin 81 mg twice daily for DVT prophylaxis.  Her H&H came down to 11 and 34.1.  She was cleared by physical therapy for discharge home with services on postop day 1.  She will remain weightbearing as tolerated on the left lower extremity.  She will remain on the aspirin 81 mg twice daily for 6 weeks and then once daily for an additional 6 weeks.  She will follow-up in the office to have her staples removed at the scheduled appointment in 2 weeks.    Test Results Pending At Discharge  Pending Labs     Order Current Status    Tissue Pathology In process        Pertinent Physical Exam At Time of Discharge  Left knee: Aquacel dressing with minimal staining.  Able to straight leg raise.  Calf soft nontender.  Negative Homans.    Issues Requiring Follow-Up  none      Outpatient Follow-Up    11/08/22 9:15am Eugenie Birks PA

## 2022-10-27 NOTE — Nursing Note (Signed)
Lydia Miles discharged with virtual nursing today, VNA services to follow at home.  All medications, follow ups and instructions gone over with.  All questions and concerns addressed.

## 2022-10-27 NOTE — Progress Notes (Signed)
Physical Therapy Treatment  Progress Note    Patient Name: Lydia Miles  MRN: [id number]  DOB: Oct 06, 1970  Today's Date: 10/27/2022    Subjective      "It feels so good just to stand on it, like I know it's stable now"    Objective     General Information  General Info  PT Received On: 10/27/22  Following Therapy Session:: Call bell within reach, Patient in Bed, Nursing Staff Aware of Patient Location  Plan of Care Reviewed With:: Patient, RN/Charge Nurse, PT  Recommended mobility with nursing staff: assist x1 with RW to bathroom/hall, please encourage pt to walk 3-5x/day    Precautions  Precautions  Weight Bearing Status-1: LLE, Weight Bearing as Tolerated  Other Precautions: Fall Risk    Pain  Pain Assessment  Pain Assessment: 0-10  Pain Score:  (2/10 at start of session, improved to 0.5 with mobility)          Vital Signs  VSS throughout    Cognition  Overall Cognitive Status: Within Functional Limits    Integumentary  Integumentary  Integumentary: PIV, ACE wrap to L LE c/d/i  LDA Integrity: Intact Pre and Post Therapy      Mobility Assessment  Bed Position: HOB elevated  Supine to Sit: Independent  Scooting: Independent    Sit to Stand: Modified Independent  Stand to Sit: Modified Independent  Assistive Device: Environmental health practitioner (Feet): 240'  Assistance Level: Modified Independent  Gait Characteristics: slow step to pattern with very short step length, heavy UE use to offload L LE during stance  Ambulation Comments: pt initially stiff, but with increase in step length and gait speed within session    Number of Stairs: 4  Assistance Level: Supervision  Assistive Device: None  Railing: Bilateral  Technique: Step-to up and down  Stairs Comments: pt leading up with R LE and leading with L LE to descend         Sitting Balance - Static: indep  Sitting Balance - Dynamic: indep  Standing Balance - Static: indep  Standing Balance - Dynamic: mod I with RW    AMPAC - (Basic Mobility Inpatient Short  Form) How much help from another person do you currently need?  Turning from your back to your side while in a flat bed without using bedrails?: 4 - None  Moving from lying on your back to sitting on the side of a flat bed without using bedrails?: 4 - None  Moving to and from a bed to a chair (including a wheelchair)?: 4 - None  Standing up from a chair using your arms (e.g., wheelchair, or bedside chair)?: 4 - None  To walk in hospital room?: 4 - None  Climbing 3-5 steps with a railing?: 3 - A little  Raw Score: 23      Education  Education  Education Provided: Role of PT/Plan of Care, Discharge Planning, Development worker, community, Safety Awareness/Fall Risk, Activities Guidelines, Pacing  Education Provided to: Patient  Teaching Method: Discussion  Learning Evaluation: Verbalizes understanding, Demonstrates/applies knowledge, Needs practice  Comments: pt reports good compliance with mobility recs, but with barrier of bed rest overnight d/t bleeding to surgical site. Pt feels like lack of mobility overnight was a set back in her recovery but is appeased with education and as movement quality and pain improve within session    Assessment   Patient is a 52 year old female. POD1 s/p L TKA. Per RN, pt cleared to participate  in session (was on bed rest overnight d/t bleeding to incision site). Patient presents with anticipated post-op mobility impairments, with mobility primarily impaired d/t L knee pain, weakness, and impaired ROM. Patient with significant improvement in gait quality and pain with mobility this session. Tolerates community distances ambulation and stair negotiation training well. PT will sign off as patient has achieve all functional goals required for safe d/c home. Plan for home services to bridge to outpatient and family to assist prn. RW provided after session. Pt and staff aware of recommendation to continue to mobilize throughout day.     Goals  Short Term Goals  Date Established/Amended: 10/26/22  Goal  timeframe: 1-2 days  Ambulation goal: pt will be able to ambulate >300' mod I with RW (anticipate goal met)  Stairs goal: pt will be able to negotiate 2 stairs with CTG (goal met)    Long Term Goals  Goal established/Amended: 10/26/22  Goal timeframe: 8 weeks  Ambulation goal: pt will be able to ambulate >1000' independently    Plan   Plan: Discharge from Skilled Inpatient PT Services     Recommendations  Anticipate Discharge to: Home with support/services  Discharge Assist/Support Needed: Intermittent Supervision, Physical assistance from family/caregiver  Home services recommended: Home PT, Home OT  Discharge Equipment Anticipated: Rolling Walker  Recommended mobility with nursing staff: assist x1 with RW to bathroom/hall, please encourage pt to walk 3-5x/day    Homero Fellers, PT  [place] lic # [id number]

## 2022-10-27 NOTE — Discharge Summary (Signed)
Discharge Summary    Discharge Diagnosis  Status post total knee replacement, left    Hospital Course   Patient is a pleasant 52 year old female who is followed by Dr. Juanetta Gosling for primary osteoarthritis of the left knee.  She has had a several year history of left knee pain progressively worsening over time.  It interferes with activities of daily living and causes her significant pain.  Despite conservative treatment options including steroid injection, oral medication, and activity modification, she still has pain.  After discussion with Dr. Juanetta Gosling last visit, she would like to proceed with a left total knee replacement.    After preoperative evaluation and medical clearance, she was brought to the operating room on Oct 26, 2022 where an uncomplicated left total knee arthroplasty was performed by Dr. Juanetta Gosling.  She was given periarticular injections at the time of surgery which minimized her pain for the first 24 hours.  Her pain was then managed with oral oxycodone.  She was put on aspirin 81 mg twice daily for DVT prophylaxis.  Her H&H came down to 11 and 34.1.  She was cleared by physical therapy for discharge home with services on postop day 1.  She will remain weightbearing as tolerated on the left lower extremity.  She will remain on the aspirin 81 mg twice daily for 6 weeks and then once daily for an additional 6 weeks.  She will follow-up in the office to have her staples removed at the scheduled appointment in 2 weeks.    Test Results Pending At Discharge  Pending Labs     Order Current Status    Tissue Pathology In process        Pertinent Physical Exam At Time of Discharge  Left knee: Aquacel dressing with minimal staining.  Able to straight leg raise.  Calf soft nontender.  Negative Homans.    Issues Requiring Follow-Up  none      Outpatient Follow-Up    11/08/22 9:15am Eugenie Birks PA

## 2022-10-27 NOTE — Progress Notes (Signed)
10/27/22 1119   Case Management Initial Assessment   Patient Discharged Before Able to Interview/Assess No   Source of Information Patient   Type of Residence Home   Lives With Spouse/Significant Other;Children   Discharge Services Anticipated at this Time Yes   Expected Discharge Needs VNA   Patient Information   Interpreter Needs None   Home Caregiver Self   Accompanied by None   Support Systems Spouse/Significant Other;Children/Family   Services Active Prior to Admission   (none)   Need PCP on Discharge No   Current Patient Responsibilities Meal Prep;Shopping;Housekeeping;Personal Care   Current Functional Status   Bathing Independent   Toileting Independent   Walking in Insurance claims handler Used None   Community Mobility Independent   IADL Assistance Needed No   Financial Information   Bed Hold N/A   Financial Coordination Notified Not Needed   Legal Information   Does Patient Have HCP No   Patient Requests Assistance for HCP No   Social Services Notified to Complete/Assist No   Foster Parent/Guardian No   Foster Parent/Guardian Paperwork Available No   Foster Parent/Guardian/SW Notified No   Discharge Planning   Patient/Family/Caregiver Discharge Preference Home w/ Services   Anticipated Discharge Disposition Home w/ VNA   Referrals   Is a Social Work Referral Indicated? No   Was Social Work referral placed? No   Next Actions   Assessment/High Risk Screening Complete   Initial Case Management Assessment    Plan Reviewed with Medical Team.    Reason for Admission: sp L TOTAL KNEE ARTHROPLASTY    Medical Team Plan: dc home today    Patient baseline prior to admission: pt states she is independent, does not use DME or VNA services at baseline. Pt lives with husband and children in ranch style home    Barriers anticipated: none    Patient/Caregiver Goals of Hospitalization: return home    Anticipated Discharge Plan/Needs: dc home with family and VNA, husband to drive home at discharge    CM met  with pt at bedside to discuss dispo. CM introduced self and role. Per provider, VNA needs to be arranged prior to dc. CM offered freedom of choice, pt chose [facility]--referral placed and accepted.  Pt states her husband will drive home at discharge. Pt states therapy provided her a RW. No other case mngt needs or concerns identified

## 2022-10-27 NOTE — Care Plan (Addendum)
AOx4. Afebrile. VSS. No cardiac complaints. No respiratory complaints maintaining SATs on RA. No n/v. Tol reg diet. Voiding in bedpan overnight. Per previous RN MD ordered to keep bedrest o/n d/t bleeding knee. L Knee ace wrap c/d/i. TEDs on, SCDs on. +pulses, +CSM, no numbness or tingling. Pt c/o discomfort to ankle and burning knee, pain meds and SCDs placed w/ good effect. IV abx given as ordered. x1 prn oxy given w/ good effect. Plan for PA to change dressing in am, plan for ?d/c. Safety maintained.

## 2022-10-27 NOTE — Discharge Instructions (Signed)
TOTAL KNEE ARTHROPLASTY    - Monitor wound for signs and symptoms of infection- I.E. fever greater than 101, redness, swelling, drainage, a dramatic increase in your discomfort which is otherwise unexplained. Please contact your surgeon at [phone].    - Dressing: If you are leaving with the Aquacell dressing, this can stay on until your post-op appointment in the office- when you have your staples out and the wound steristripped. If you have a concern regarding your dressing, please contact the office at [phone]    - No showering until staples are out and there is no bleeding or drainage from the wound    - Bilateral lower extremity TED when out of bed. These may be removed at night, but should be worn during the day. Please contact our office if there are any skin changes due to stockings. [If going to rehab use Venodyne compression boots while in bed.    - Call 911 if you develop chest pain or difficulty breathing.    - Ice operated knee for three times daily and as needed to decrease pain and swelling. Ice must not come into direct contact with patients skin. Place a dry interface...I.E. Ace bandage or towel between cuff and skin to prevent frostbite    - VNA physical therapist will see you the day after discharge. They will show you a physical therapy program. If they do not, please call [phone].    - Occupational therapy consult if needed for assistance with activities of daily living.    - Do not drive, operate machinery or consume Alcoholic beverages while you are taking prescription pain medications    - Anticoagulation:     o Enteric coated Aspirin [81] mg po twice daily. Take with food and water and continue for 6 weeks, at this time you will be transitioned to Aspirin 81mg  PO daily x 6 weeks    Your follow-up appointment has been made for 11/08/22 at 9:15am with Eugenie Birks PA

## 2022-10-27 NOTE — Nursing Note (Signed)
Discharge note:   Service wrote order for pt to  Be d/c home with services.  Meds to bed ordered and delivered. VRN reviewed after visit summary with the patient and she stated to understand.  Tele and IV removed. Pt along with personal belongings and walker transported to lobby.

## 2022-11-11 LAB — TISSUE PATHOLOGY

## 2023-07-26 IMAGING — MR COLUNA^LOMBAR [ID]
4 of 5 series · 23 of 48 positions shown · non-contrast
Comparison: none

[Series 3: STIR · coronal · 6.0mm · 0.68mm/px · 3 of 8 slices shown]
[im 2/8]
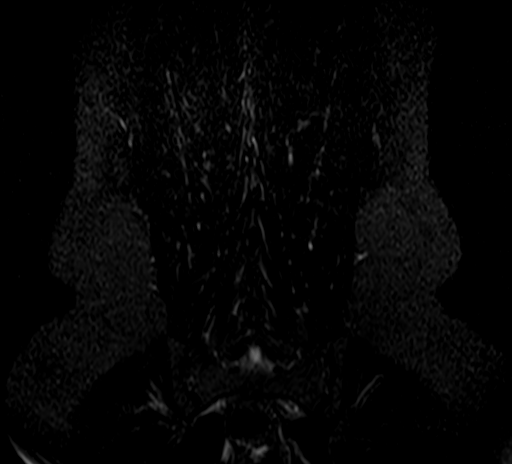
[im 5/8]
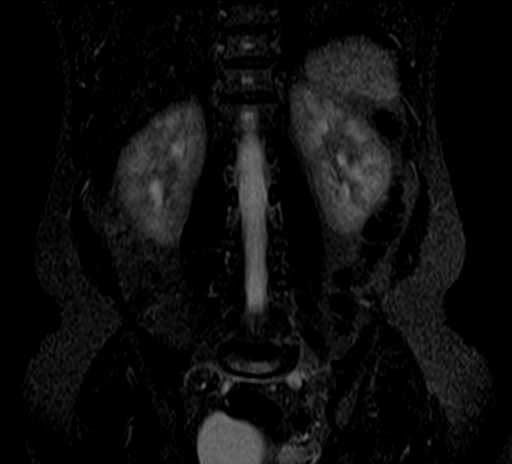
[im 8/8]
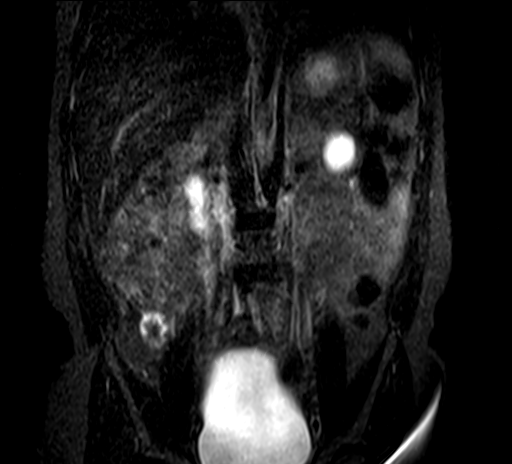

[Series 4: T2 · sagittal · 4.0mm · 0.68mm/px · 8 of 15 slices shown (1 of 2)]
[im 1/15]
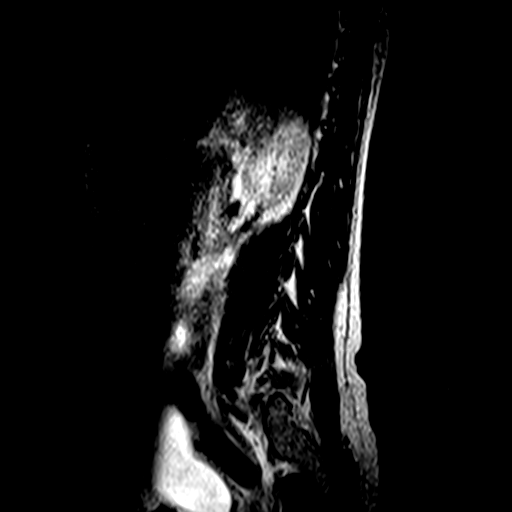
[im 2/15]
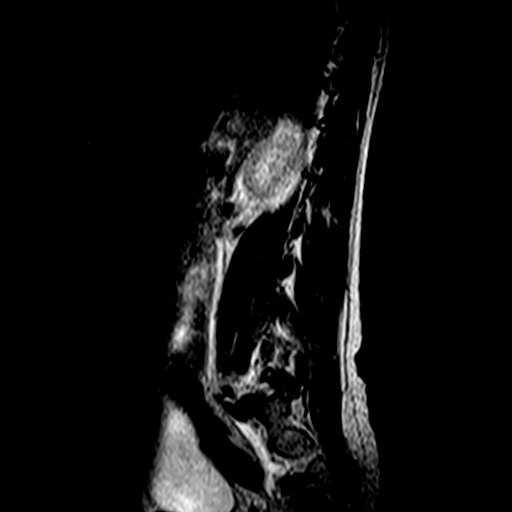
[im 5/15]
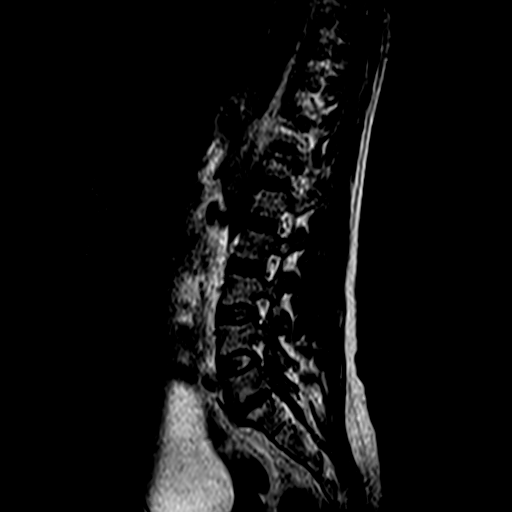
[im 7/15]
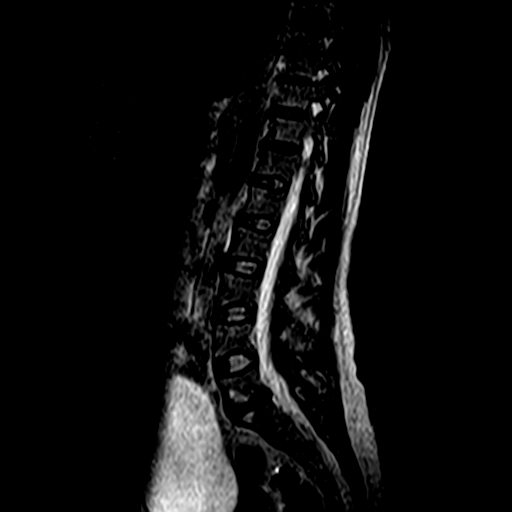
[im 8/15]
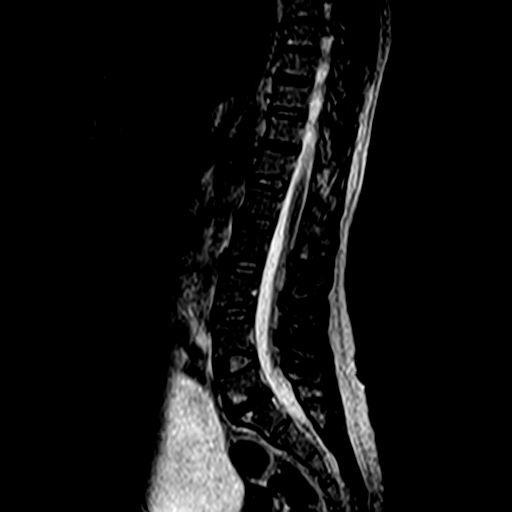
[im 10/15]
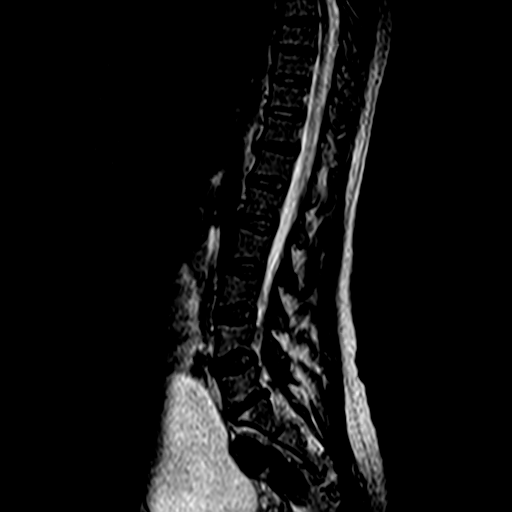
[im 13/15]
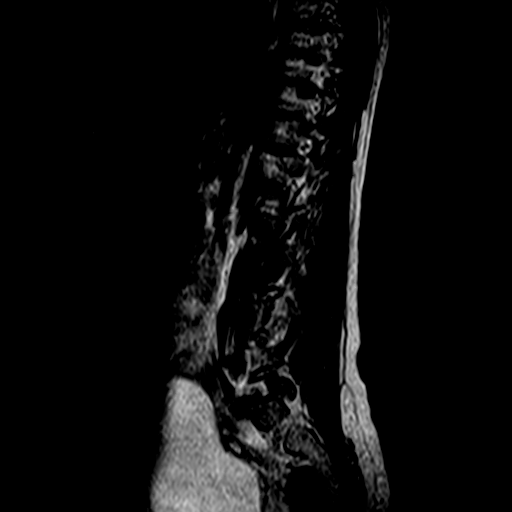
[im 15/15]
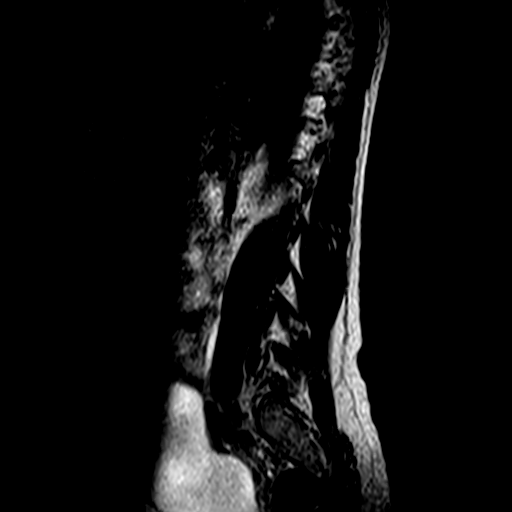

[Series 5: T1 · sagittal · 4.0mm · 0.68mm/px · 4 of 15 slices shown]
[im 1/15]
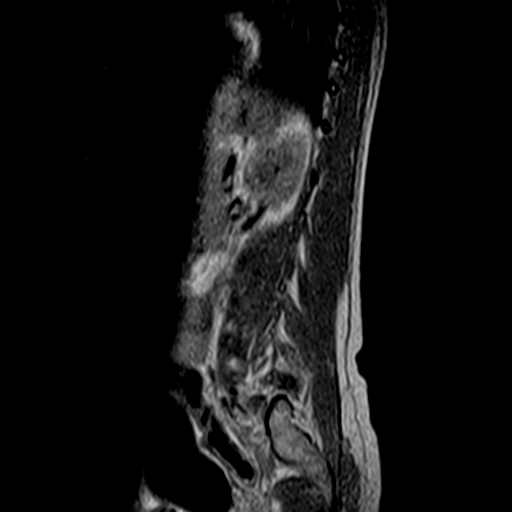
[im 2/15]
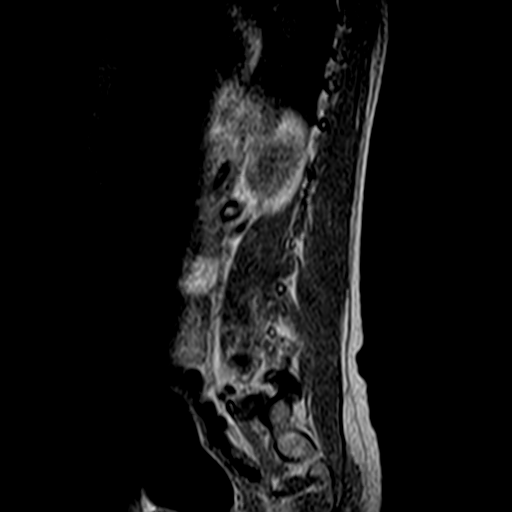
[im 8/15]
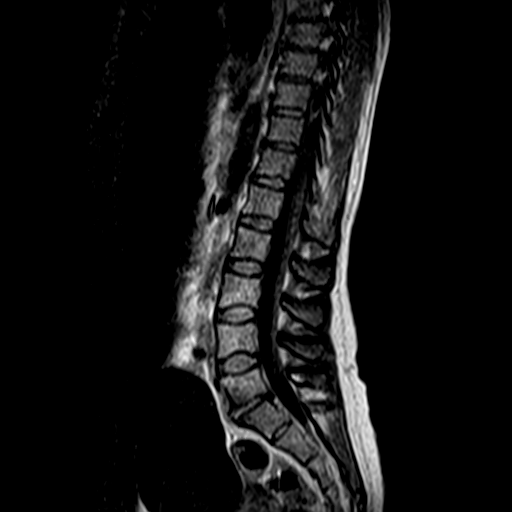
[im 13/15]
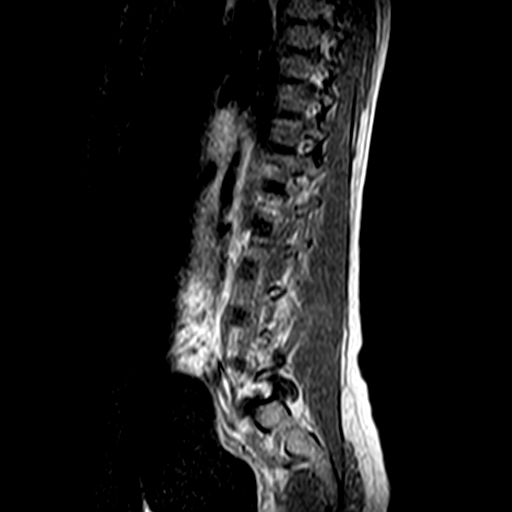

[Series 9: T2 · axial · 4.0mm · 0.44mm/px · z∈[-118,+43]mm · 8 of 20 slices shown (2 of 2)]
[im 1/20]
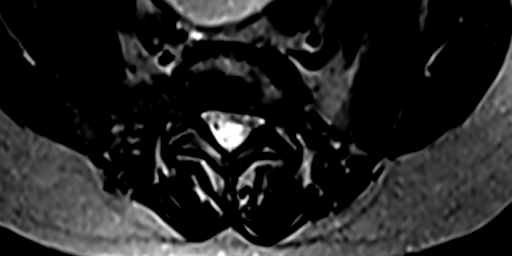
[im 3/20]
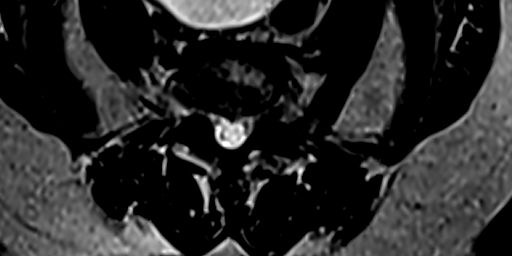
[im 6/20]
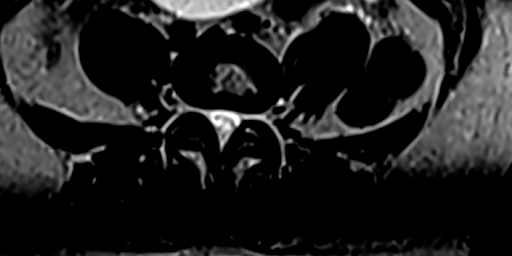
[im 9/20]
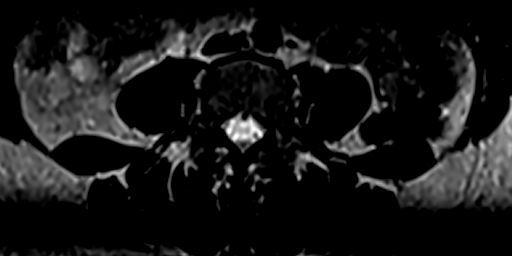
[im 11/20]
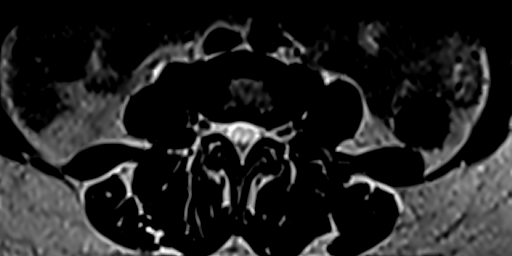
[im 14/20]
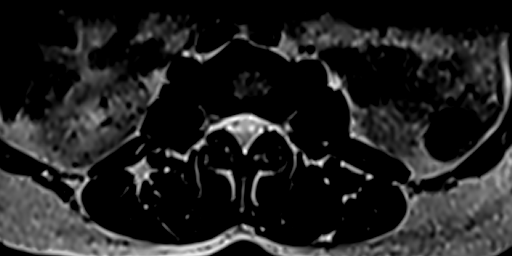
[im 17/20]
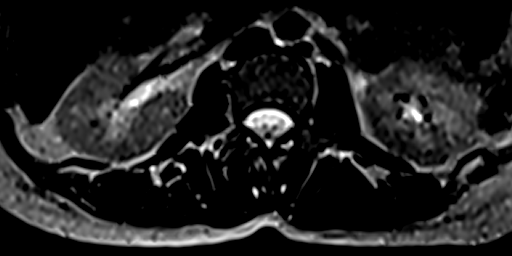
[im 20/20]
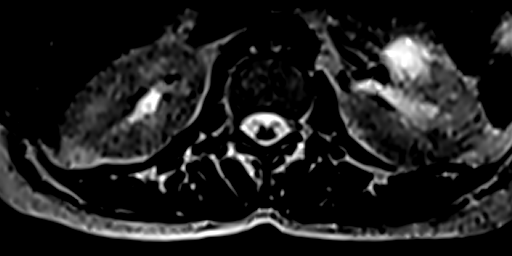

[23 of 48 positions shown; findings below may reference images not displayed]

RESSONÂNCIA MAGNÉTICA DE COLUNA LOMBAR

TÉCNICA:
Exame realizado em equipamento de ressonância magnética com sequências, ponderações e planos específicos para o segmento de interesse, sem a administração endovenosa do meio de contraste.

RESULTADO:
Leve acentuação da lordose lombar fisiológica.
Redução da altura e da intensidade de sinal do disco intervertebral de L5-S1 com alterações degenerativas nos platôs vertebrais apostos.
Edema na topografia da bursa interespinhosa de L5-S1.
Pequeno abaulamento disco-osteofitário difuso em L5-S1 com componente focal pósteromediano indentando a face ventral do saco dural e insinuando-se nas bases foraminais um pouco mais evidente à direita onde toca a margem inferior da raiz neural descendente correspondente.
Os corpos vertebrais visualizados são alinhados e apresentam formas e dimensões usuais.
Os demais forames de conjugação visualizados são livres e apresentam amplitudes usuais.
Elementos posteriores visualizados íntegros.
Articulações interapofisárias preservadas.
O canal vertebral ósseo apresenta amplitude usual na região estudada.
O cone medular é tópico e tem aspecto anatômico.

CONCLUSÃO: 
Bursite interespinhosa em L5-S1.
Discopatia degenerativa lombar inferior.
Pequeno abaulamento disco-osteofitário difuso em L5-S1 com componente focal pósteromediano indentando a face ventral do saco dural e insinuando-se nas bases foraminais um pouco mais evidente à direita onde toca a margem inferior da raiz neural descendente correspondente.

## 2023-07-26 IMAGING — MR COLUNA^CERVICAL
4 of 5 series · 23 of 48 positions shown · non-contrast
Comparison: none

[Series 2: STIR · coronal · 4.0mm · 0.73mm/px · 6 of 12 slices shown (1 of 2)]
[im 1/12]
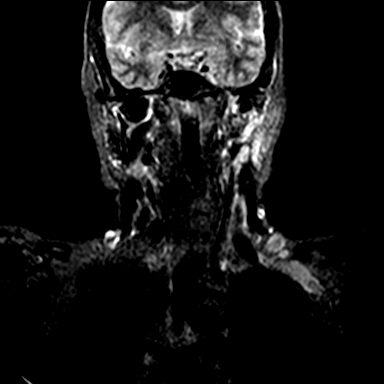
[im 3/12]
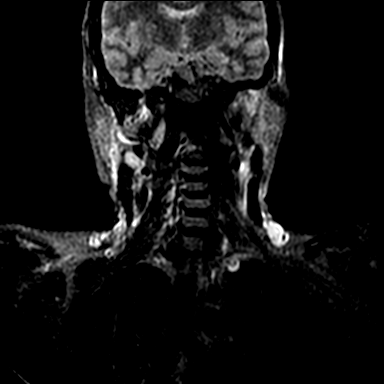
[im 5/12]
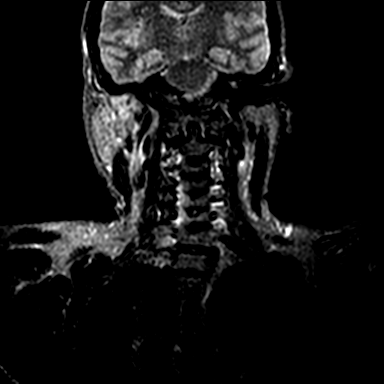
[im 7/12]
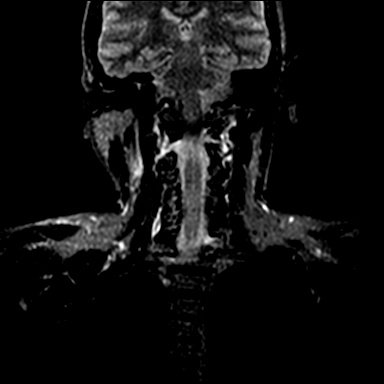
[im 9/12]
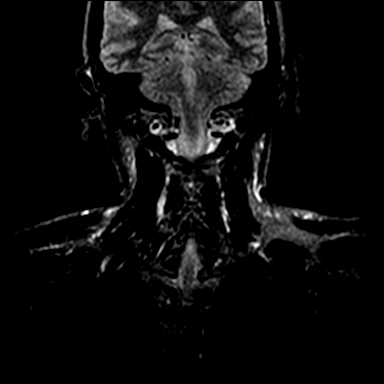
[im 12/12]
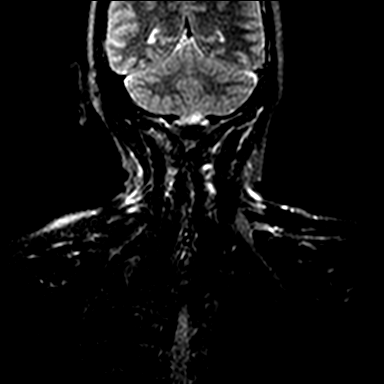

[Series 4: T2 · sagittal · 3.5mm · 0.51mm/px · 6 of 12 slices shown]
[im 1/12]
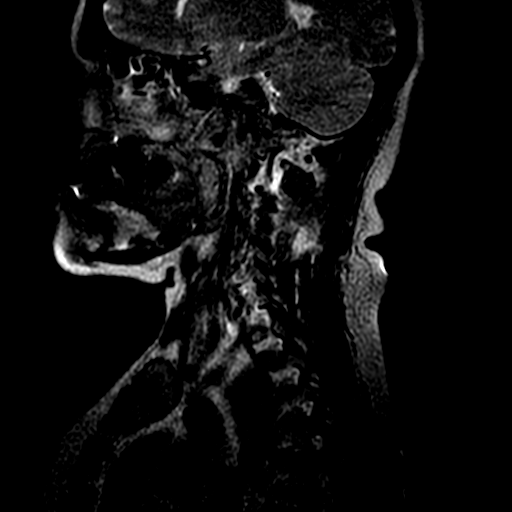
[im 3/12]
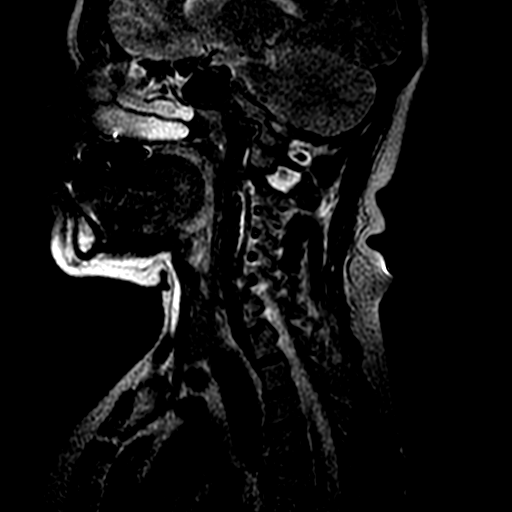
[im 5/12]
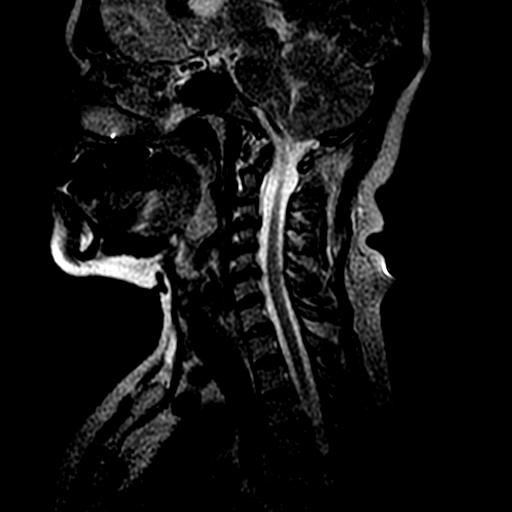
[im 7/12]
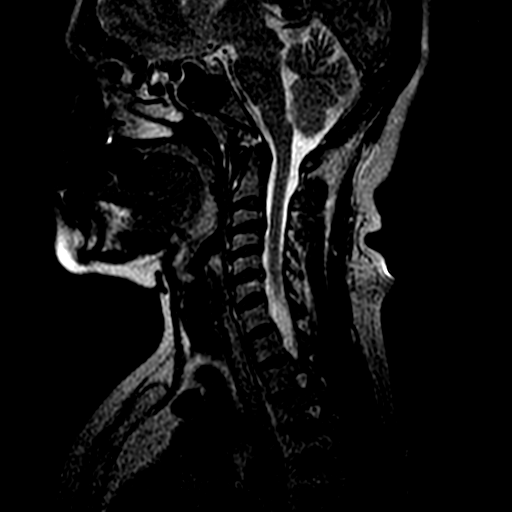
[im 9/12]
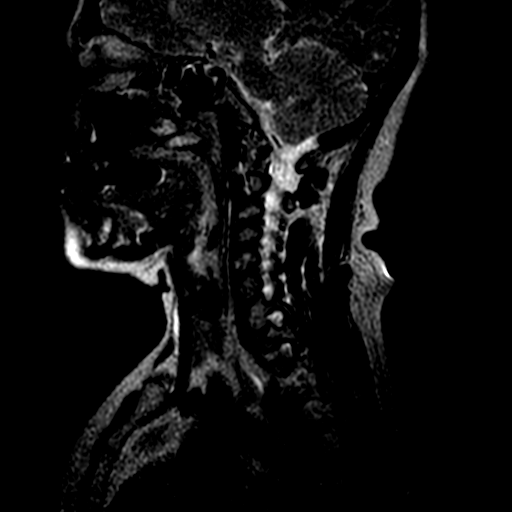
[im 12/12]
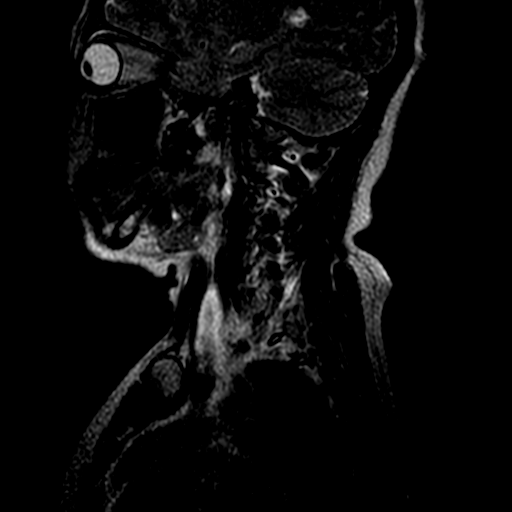

[Series 5: T1 · sagittal · 3.5mm · 0.51mm/px · 7 of 12 slices shown]
[im 1/12]
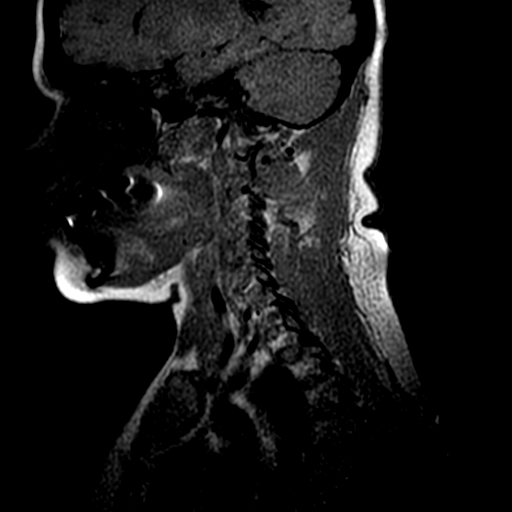
[im 2/12]
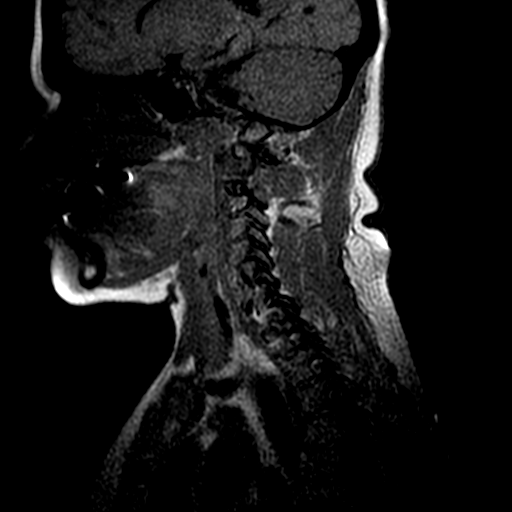
[im 4/12]
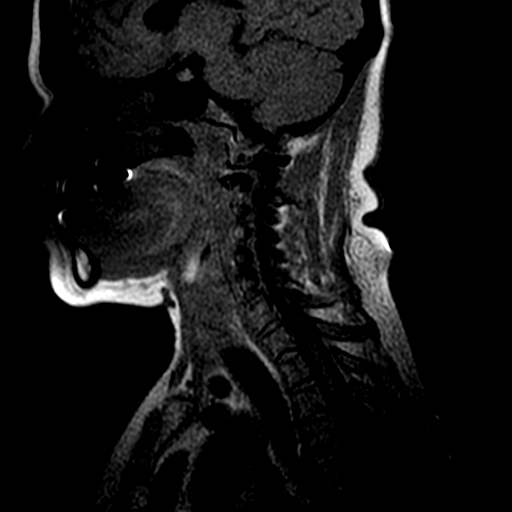
[im 6/12]
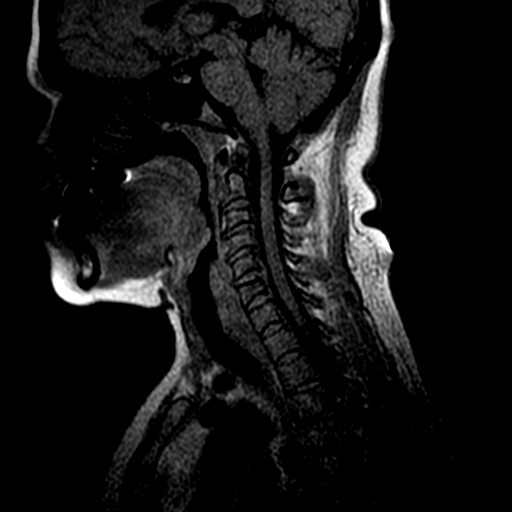
[im 8/12]
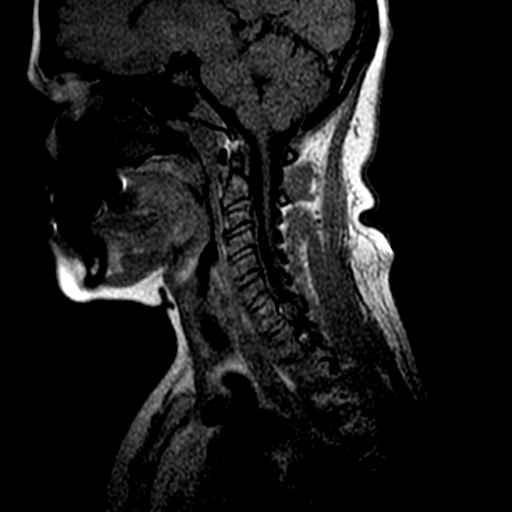
[im 10/12]
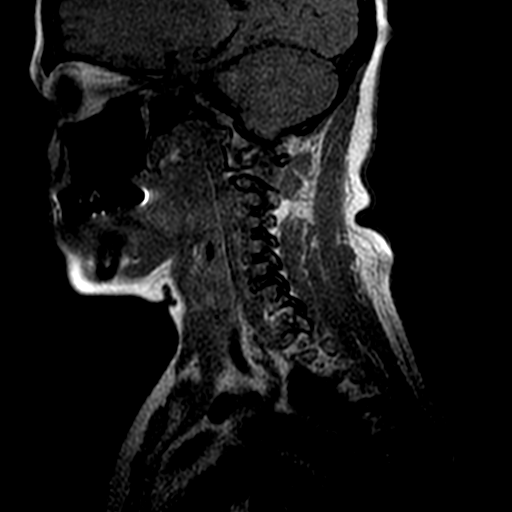
[im 12/12]
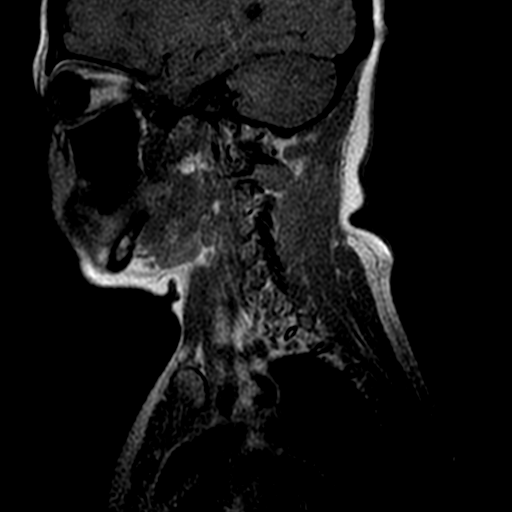

[Series 7: STIR · sagittal · 3.5mm · 0.73mm/px · 4 of 12 slices shown (2 of 2)]
[im 1/12]
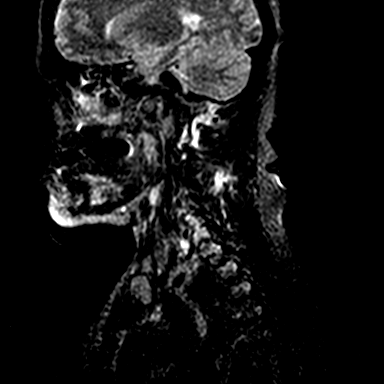
[im 2/12]
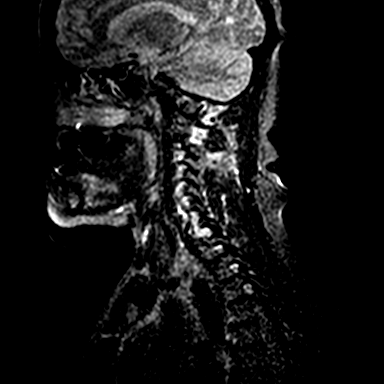
[im 6/12]
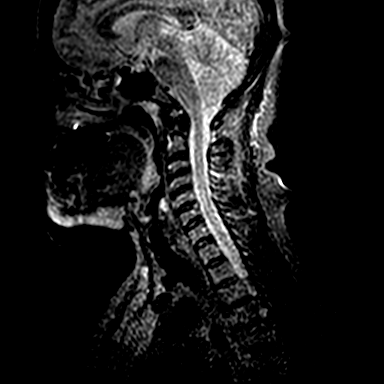
[im 10/12]
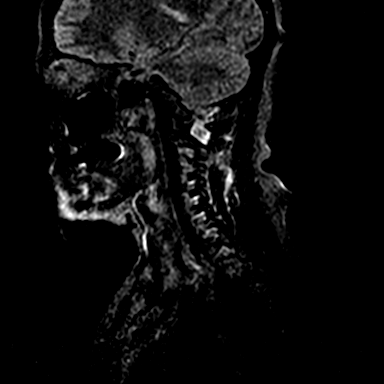

[23 of 48 positions shown; findings below may reference images not displayed]

RESSONÂNCIA MAGNÉTICA DE COLUNA CERVICAL

TÉCNICA: Exame realizado em equipamento de ressonância magnética com sequências, ponderações e planos específicos para o segmento de interesse, sem a administração endovenosa do meio de contraste.

RESULTADO:
Leve retificação da lordose cervical fisiológica.
Corpos vertebrais de altura preservada com osteofitose marginal anterior.
Uncoartrose à direita em C5-C6 C6-C7.
Artrose interapofisária bilateral em C4-C5 C5-C6 caracterizada por redução da fenda articular, esclerose e irregularidade óssea subcondral nas facetas ósseas apostas.
Elementos posteriores estudados íntegros.
O canal vertebral ósseo apresenta amplitude usual.
Os forames de conjugação estudados são livres e apresentam amplitudes usuais.
Pequena protrusão discal focal póstero-mediana em C2-C3 indentando a face ventral do saco dural.
Complexo disco-osteofitário pósteromediano / paramediano esquerdo em C3-C4 comprimindo a face ventral do saco dural.
Pequena protrusão discal focal póstero-mediana em C4-C5 indentando a face ventral do saco dural.
Protrusão disco-osteofitária póstero-mediana em C5-C6 com sinais de rotura do ânulo fibroso comprimindo de forma significativa a face ventral do saco dural.
Mínima protrusão discal póstero-mediana em C6-C7 retificando a face ventral do saco dural.
Medula apresenta forma e dimensões usuais nas regiões estudadas.

CONCLUSÃO:
Leves alterações degenerativas espondilodiscais.
Pequena protrusão discal focal póstero-mediana em C2-C3 indentando a face ventral do saco dural.
Complexo disco-osteofitário pósteromediano / paramediano esquerdo em C3-C4 comprimindo a face ventral do saco dural.
Pequena protrusão discal focal póstero-mediana em C4-C5 indentando a face ventral do saco dural.
Protrusão disco-osteofitária póstero-mediana em C5-C6 com sinais de rotura do ânulo fibroso comprimindo de forma significativa a face ventral do saco dural.
Mínima protrusão discal póstero-mediana em C6-C7 retificando a face ventral do saco dural.
